# Patient Record
Sex: Female | Born: 2000 | Race: Black or African American | Hispanic: No | Marital: Single | Smoking: Never smoker
Health system: Southern US, Community
[De-identification: ages and names within clinical notes are randomized; demographics above are authoritative.]

---

## 2021-04-19 ENCOUNTER — Ambulatory Visit (HOSPITAL_COMMUNITY)
Admission: EM | Admit: 2021-04-19 | Discharge: 2021-04-19 | Disposition: A | Discharge status: Home / Self Care | Payer: Federal, State, Local not specified - PPO | Attending: Emergency Medicine | Admitting: Emergency Medicine

## 2021-04-19 ENCOUNTER — Other Ambulatory Visit: Payer: Self-pay

## 2021-04-19 ENCOUNTER — Encounter (HOSPITAL_COMMUNITY): Payer: Self-pay | Admitting: Emergency Medicine

## 2021-04-19 DIAGNOSIS — R519 Headache, unspecified: Secondary | ICD-10-CM | POA: Diagnosis not present

## 2021-04-19 MED ORDER — SUMATRIPTAN SUCCINATE 50 MG PO TABS: 50.0000 mg | ORAL_TABLET | ORAL | 0 refills | Status: AC | PRN

## 2021-04-19 MED ORDER — LIDOCAINE HCL (PF) 1 % IJ SOLN
INTRAMUSCULAR | Status: AC
  Filled 2021-04-19: qty 30

## 2021-04-19 MED ORDER — SUMATRIPTAN SUCCINATE 6 MG/0.5ML ~~LOC~~ SOLN
SUBCUTANEOUS | Status: AC
  Filled 2021-04-19: qty 0.5

## 2021-04-19 MED ORDER — IBUPROFEN 800 MG PO TABS: 800.0000 mg | ORAL_TABLET | Freq: Three times a day (TID) | ORAL | 0 refills | Status: AC

## 2021-04-19 MED ORDER — DEXAMETHASONE SODIUM PHOSPHATE 10 MG/ML IJ SOLN
10.0000 mg | Freq: Once | INTRAMUSCULAR | Status: AC
Start: 2021-04-19 — End: 2021-04-19
  Administered 2021-04-19: 10 mg via INTRAMUSCULAR

## 2021-04-19 MED ORDER — KETOROLAC TROMETHAMINE 30 MG/ML IJ SOLN
30.0000 mg | Freq: Once | INTRAMUSCULAR | Status: AC
  Administered 2021-04-19: 30 mg via INTRAMUSCULAR

## 2021-04-19 MED ORDER — SUMATRIPTAN SUCCINATE 6 MG/0.5ML ~~LOC~~ SOLN
6.0000 mg | Freq: Once | SUBCUTANEOUS | Status: AC
Start: 2021-04-19 — End: 2021-04-19
  Administered 2021-04-19: 6 mg via SUBCUTANEOUS

## 2021-04-19 MED ORDER — DEXAMETHASONE SODIUM PHOSPHATE 10 MG/ML IJ SOLN
INTRAMUSCULAR | Status: AC
  Filled 2021-04-19: qty 1

## 2021-04-19 MED ORDER — KETOROLAC TROMETHAMINE 30 MG/ML IJ SOLN
INTRAMUSCULAR | Status: AC
  Filled 2021-04-19: qty 1

## 2021-04-19 NOTE — ED Triage Notes (Signed)
Pt reports intermittent headache since December. Pt states the headaches have gotten worse over the past couple days.

## 2021-04-19 NOTE — ED Provider Notes (Signed)
[facility]    CSN: [id number] Arrival date & time: 04/19/21  1826      History   Chief Complaint Chief Complaint  Patient presents with   Migraine    HPI Erica Bruce is a 21 y.o. female.   Patient presents with right-sided frontal headache, sitting behind the right eye that has been occurring daily since mid December.  Pain initially felt like pressure but after taking a Z-Pak for a sinus infection pain is now described as aching.  Pain does not radiate.  Associated phonophobia.  Endorses that headache is generally a 7 out of 10 but at times can be a 10 out of 10.  Endorses she feels that symptoms are worsening.  Has been seen by PCP who prescribed Fiorinal which was not effective and then attempted use of a prednisone course, endorses that this toned down the headache but does not resolve it.  Denies photophobia, nausea, blurred vision, dizziness, weakness, lightheadedness, changes with speech.  Daily use of birth control pills.  No pertinent medical history.  Has been referred to neurology, unable to be seen until May 2023.   History reviewed. No pertinent past medical history.  There are no problems to display for this patient.   History reviewed. No pertinent surgical history.  OB History   No obstetric history on file.      Home Medications    Prior to Admission medications   Not on File    Family History Family History  Problem Relation Age of Onset   Healthy Mother    Healthy Father     Social History Social History   Tobacco Use   Smoking status: Never   Smokeless tobacco: Never     Allergies   Patient has no allergy information on record.   Review of Systems Review of Systems  Constitutional: Negative.   HENT: Negative.    Respiratory: Negative.    Cardiovascular: Negative.   Neurological:  Positive for headaches. Negative for dizziness, tremors, seizures, syncope, facial asymmetry, speech difficulty, weakness, light-headedness  and numbness.    Physical Exam Triage Vital Signs ED Triage Vitals  Enc Vitals Group     BP 04/19/21 1915 125/83     Pulse Rate 04/19/21 1915 70     Resp 04/19/21 1915 18     Temp 04/19/21 1915 97.7 F (36.5 C)     Temp Source 04/19/21 1915 Oral     SpO2 04/19/21 1915 99 %     Weight 04/19/21 1916 164 lb (74.4 kg)     Height 04/19/21 1916 5\' 8"  (1.727 m)     Head Circumference --      Peak Flow --      Pain Score 04/19/21 1916 8     Pain Loc --      Pain Edu? --      Excl. in GC? --    No data found.  Updated Vital Signs BP 125/83 (BP Location: Right Arm)    Pulse 70    Temp 97.7 F (36.5 C) (Oral)    Resp 18    Ht 5\' 8"  (1.727 m)    Wt 164 lb (74.4 kg)    SpO2 99%    BMI 24.94 kg/m   Visual Acuity Right Eye Distance:   Left Eye Distance:   Bilateral Distance:    Right Eye Near:   Left Eye Near:    Bilateral Near:     Physical Exam Constitutional:  Appearance: Normal appearance. She is normal weight.  HENT:     Head: Normocephalic.     Right Ear: Tympanic membrane, ear canal and external ear normal.     Left Ear: Tympanic membrane, ear canal and external ear normal.  Eyes:     Extraocular Movements: Extraocular movements intact.  Pulmonary:     Effort: Pulmonary effort is normal.  Skin:    General: Skin is warm and dry.  Neurological:     General: No focal deficit present.     Mental Status: She is alert and oriented to person, place, and time. Mental status is at baseline.  Psychiatric:        Mood and Affect: Mood normal.        Behavior: Behavior normal.     UC Treatments / Results  Labs (all labs ordered are listed, but only abnormal results are displayed) Labs Reviewed - No data to display  EKG   Radiology No results found.  Procedures Procedures (including critical care time)  Medications Ordered in UC Medications - No data to display  Initial Impression / Assessment and Plan / UC Course  I have reviewed the triage vital signs  and the nursing notes.  Pertinent labs & imaging results that were available during my care of the patient were reviewed by me and considered in my medical decision making (see chart for details).  Bad Headache  Vital signs are stable, patient is in no signs of distress, no abnormalities noted on the neurological exam, discussed with patient that continuation of headaches will require follow-up with neurology, advised patient to take appointment in May and recheck with office to see if she can be seen sooner, Toradol, Decadron and Imitrex injection given in office, prescribed ibuprofen and Imitrex for outpatient use, patient given strict precautions that for any signs of worsening symptoms or if symptoms require more frequent that she is to go to the nearest emergency department for further evaluation, verbalized understanding Final Clinical Impressions(s) / UC Diagnoses   Final diagnoses:  None   Discharge Instructions   None    ED Prescriptions   None    PDMP not reviewed this encounter.   Valinda Hoar, NP 04/20/21 1642

## 2021-04-19 NOTE — Discharge Instructions (Signed)
The cause of your headache is unknown at this time while it could be could be migraines, please watch carefully as it could also be something more serious, at any point if your headaches continue to become more frequent, worsening in severity or you have the worst headache you have ever experienced please go to the nearest emergency department immediately for evaluation  On your neurological exam today there appeared to be normal abnormalities, please reach out to the neurologist to set up an appointment even if it is far out  He has been given injections today to help ideally calm your headache or get rid of it so that you are able to manage at home  Once at home attempted use of ibuprofen first, if no improvement seen within an hour, take a dose of Imitrex then wait 2 hours if headache is still present you may take a second dose, do not take more than 4 tablets within a 24-hour.  You may follow-up with urgent care or primary care doctor as needed for further management until seen by neurologist

## 2021-04-22 NOTE — Progress Notes (Signed)
NEUROLOGY CONSULTATION NOTE  Erica Bruce MRN: [id number] DOB: [birth year]  Referring provider: Rolm Baptise, MD Primary care provider: Heywood Bene, PA-C  Reason for consult:  migraines  Assessment/Plan:   Migraine without aura, without status migrainosus, not intractable Persistent right orbital pain  Check MRI of brain with and without contrast Start nortriptyline 25mg  at bedtime.  Increase to 50mg  at bedtime in 6 weeks if needed. May use ibuprofen 800mg  or sumatriptan 50mg  as needed.  Limit use of pain relievers to no more than 2 days out of week to prevent risk of rebound or medication-overuse headache. Keep headache diary Advised to get formal eye exam. Follow up 4 months.    Subjective:  Erica Bruce is a 21 year old right-handed female who presents for migraines.  History supplemented by Urgent Care and referring provider's notes.  About a couple of months ago, she developed a new persistent right sided pressure-like headache that has gotten worse.  No preceding injury or illness.  She was treated with a prednisone taper and later a Z pak for presumed sinusitis which helped reduce severity but the pressure persisted.  She now has a persistent right periorbital/orbital dull pressure.  The right eye feels different to touch when compared to the left eye.   About once a week she will have a 7-8/10 frontal pressure headache with phonophobia but no photophobia, nausea, vomiting, visual disturbance, numbness or weakness.  Lasts 30 minutes when treated with ibuprofen.  No specific triggers.  Taking out her contact lenses may help.    Current NSAIDS/analgesics:  ibuprofen 800mg  Current triptans:  sumatriptan 50mg  Current ergotamine:  none Current anti-emetic:  none Current muscle relaxants:  none Current Antihypertensive medications:  none Current Antidepressant medications:  none Current Anticonvulsant medications:  none Current anti-CGRP:  none Current  Vitamins/Herbal/Supplements:  none Current Antihistamines/Decongestants:  none Other therapy:  none Hormone/birth control:  none  Past NSAIDS/analgesics:  Fiorinal  Past abortive triptans:  sumatriptan 6mg  [place] Past abortive ergotamine:  none Past muscle relaxants:  none Past anti-emetic:  none Past antihypertensive medications:  none Past antidepressant medications:  none Past anticonvulsant medications:  none Past anti-CGRP:  none Past vitamins/Herbal/Supplements:  none Past antihistamines/decongestants:  none Other past therapies:  no  Caffeine:  Rarely coffee.  Red Bull once in a while.   Alcohol:  no Smoker:  no Diet:  drinks water throughout the day.  No specific diet.   Exercise:  not routine Depression:  no; Anxiety:  yes Other pain:  no Sleep hygiene:  varies.  Goes to bed late.  Sleeps 7-10 hours a night Family history of headache:  no      PAST MEDICAL HISTORY: No past medical history on file.  PAST SURGICAL HISTORY: No past surgical history on file.  MEDICATIONS: Current Outpatient Medications on File Prior to Visit  Medication Sig Dispense Refill   ibuprofen (ADVIL) 800 MG tablet Take 1 tablet (800 mg total) by mouth 3 (three) times daily. 21 tablet 0   SUMAtriptan (IMITREX) 50 MG tablet Take 1 tablet (50 mg total) by mouth every 2 (two) hours as needed for migraine. May repeat in 2 hours if headache persists or recurs. 10 tablet 0   No current facility-administered medications on file prior to visit.    ALLERGIES: Not on File  FAMILY HISTORY: Family History  Problem Relation Age of Onset   Healthy Mother    Healthy Father     Objective:  Blood pressure 114/75, pulse 94, height 5'  8" (1.727 m), weight 169 lb (76.7 kg), SpO2 100 %. General: No acute distress.  Patient appears well-groomed.   Head:  Normocephalic/atraumatic Eyes:  fundi examined but not visualized Neck: supple, no paraspinal tenderness, full range of motion Back: No paraspinal  tenderness Heart: regular rate and rhythm Lungs: Clear to auscultation bilaterally. Vascular: No carotid bruits. Neurological Exam: Mental status: alert and oriented to person, place, and time, recent and remote memory intact, fund of knowledge intact, attention and concentration intact, speech fluent and not dysarthric, language intact. Cranial nerves: CN I: not tested CN II: pupils equal, round and reactive to light, visual fields intact CN III, IV, VI:  full range of motion, no nystagmus, no ptosis CN V: facial sensation intact. CN VII: upper and lower face symmetric CN VIII: hearing intact CN IX, X: gag intact, uvula midline CN XI: sternocleidomastoid and trapezius muscles intact CN XII: tongue midline Bulk & Tone: normal, no fasciculations. Motor:  muscle strength 5/5 throughout Sensation:  Pinprick, temperature and vibratory sensation intact. Deep Tendon Reflexes:  2+ throughout,  toes downgoing.   Finger to nose testing:  Without dysmetria.   Heel to shin:  Without dysmetria.   Gait:  Normal station and stride.  Romberg negative.    Thank you for allowing me to take part in the care of this patient.  Metta Clines, DO  CC: Rolm Baptise, MD

## 2021-04-25 ENCOUNTER — Ambulatory Visit: Payer: Federal, State, Local not specified - PPO | Admitting: Neurology

## 2021-04-25 ENCOUNTER — Encounter: Payer: Self-pay | Admitting: Neurology

## 2021-04-25 ENCOUNTER — Other Ambulatory Visit: Payer: Self-pay

## 2021-04-25 VITALS — BP 114/75 | HR 94 | Ht 68.0 in | Wt 169.0 lb

## 2021-04-25 DIAGNOSIS — G4452 New daily persistent headache (NDPH): Secondary | ICD-10-CM | POA: Diagnosis not present

## 2021-04-25 DIAGNOSIS — G43009 Migraine without aura, not intractable, without status migrainosus: Secondary | ICD-10-CM | POA: Diagnosis not present

## 2021-04-25 MED ORDER — NORTRIPTYLINE HCL 25 MG PO CAPS: 25.0000 mg | ORAL_CAPSULE | Freq: Every day | ORAL | 3 refills | Status: DC

## 2021-04-25 NOTE — Patient Instructions (Signed)
Start nortriptyline 25mg  at bedtime.  If no improvement in 6 weeks, contact me Limit use of pain relievers to no more than 2 days out of week to prevent risk of rebound or medication-overuse headache. Keep headache diary MRI of brain with and without contrast Follow up 4 months.

## 2021-05-13 ENCOUNTER — Ambulatory Visit
Admission: RE | Admit: 2021-05-13 | Discharge: 2021-05-13 | Disposition: A | Discharge status: Home / Self Care | Payer: Federal, State, Local not specified - PPO | Source: Ambulatory Visit | Attending: Neurology | Admitting: Neurology

## 2021-05-13 ENCOUNTER — Other Ambulatory Visit: Payer: Self-pay

## 2021-05-13 DIAGNOSIS — G4452 New daily persistent headache (NDPH): Secondary | ICD-10-CM

## 2021-05-13 DIAGNOSIS — G43009 Migraine without aura, not intractable, without status migrainosus: Secondary | ICD-10-CM

## 2021-05-13 MED ORDER — GADOBENATE DIMEGLUMINE 529 MG/ML IV SOLN
15.0000 mL | Freq: Once | INTRAVENOUS | Status: AC | PRN
  Administered 2021-05-13: 15 mL via INTRAVENOUS

## 2021-05-13 NOTE — Progress Notes (Signed)
Left message to contact office 05/13/2021 at 148  ?

## 2021-05-17 ENCOUNTER — Telehealth: Payer: Self-pay | Admitting: Neurology

## 2021-05-17 NOTE — Telephone Encounter (Signed)
Patient called and stated she need her nortriptyline prescription sent to [facility] in [place] .  [address] [id number].  She is away at school. ?

## 2021-05-18 MED ORDER — NORTRIPTYLINE HCL 25 MG PO CAPS: 25.0000 mg | ORAL_CAPSULE | Freq: Every day | ORAL | 3 refills | Status: DC

## 2021-06-04 ENCOUNTER — Encounter (HOSPITAL_COMMUNITY): Payer: Self-pay | Admitting: Emergency Medicine

## 2021-06-04 ENCOUNTER — Emergency Department (HOSPITAL_COMMUNITY)
Admission: EM | Admit: 2021-06-04 | Discharge: 2021-06-04 | Disposition: A | Discharge status: Home / Self Care | Payer: Federal, State, Local not specified - PPO | Attending: Emergency Medicine | Admitting: Emergency Medicine

## 2021-06-04 ENCOUNTER — Other Ambulatory Visit: Payer: Self-pay

## 2021-06-04 DIAGNOSIS — J029 Acute pharyngitis, unspecified: Secondary | ICD-10-CM

## 2021-06-04 DIAGNOSIS — Z20822 Contact with and (suspected) exposure to covid-19: Secondary | ICD-10-CM | POA: Diagnosis not present

## 2021-06-04 DIAGNOSIS — J028 Acute pharyngitis due to other specified organisms: Secondary | ICD-10-CM | POA: Diagnosis not present

## 2021-06-04 DIAGNOSIS — B9789 Other viral agents as the cause of diseases classified elsewhere: Secondary | ICD-10-CM | POA: Diagnosis not present

## 2021-06-04 LAB — RESP PANEL BY RT-PCR (FLU A&B, COVID) ARPGX2
Influenza A by PCR: NEGATIVE
Influenza B by PCR: NEGATIVE
SARS Coronavirus 2 by RT PCR: NEGATIVE

## 2021-06-04 LAB — GROUP A STREP BY PCR: Group A Strep by PCR: NOT DETECTED

## 2021-06-04 NOTE — ED Triage Notes (Signed)
C/o sore throat, body aches, and feeling hot for 2-3 days. ?

## 2021-06-04 NOTE — Discharge Instructions (Signed)
Your COVID, flu, and strep testing were all negative today. Your symptoms are likely viral in nature and can take up to 7-10 days for your body to fight off.  ? ?Please take Ibuprofen and Tylenol as needed for your sore throat and body aches. Continue drinking plenty of fluids to stay hydrated and rest as much as possible.  ? ?Follow up with your PCP for further evaluation ? ?Return to the ED for any new/worsening symptoms including worsening sore throat, inability to swallow, drooling on yourself, muffled voice, or any other new/concerning symptoms ?

## 2021-06-04 NOTE — ED Provider Notes (Signed)
?[facility] EMERGENCY DEPARTMENT ?Provider Note ? ? ?CSN: [id number] ?Arrival date & time: 06/04/21  1459 ? ?  ? ?History ? ?No chief complaint on file. ? ? ?Erica Bruce is a 21 y.o. female who presents to the ED today with complaint of gradual onset, constant, achy, sore throat that began yesterday. Pt also complains of subjective fevers and body aches. Denies any recent sick contacts. Has not taken anything specifically for her symptoms. Denies difficulty swallowing, drooling, voice change, ear pain, sneezing, rhinorrhea, or any other associated symptoms.  ? ?The history is provided by the patient and medical records.  ? ?  ? ?Home Medications ?Prior to Admission medications   ?Medication Sig Start Date End Date Taking? Authorizing Provider  ?ibuprofen (ADVIL) 800 MG tablet Take 1 tablet (800 mg total) by mouth 3 (three) times daily. 04/19/21   Valinda Hoar, NP  ?nortriptyline (PAMELOR) 25 MG capsule Take 1 capsule (25 mg total) by mouth at bedtime. 05/18/21   Drema Dallas, DO  ?SUMAtriptan (IMITREX) 50 MG tablet Take 1 tablet (50 mg total) by mouth every 2 (two) hours as needed for migraine. May repeat in 2 hours if headache persists or recurs. 04/19/21   Valinda Hoar, NP  ?   ? ?Allergies    ?Patient has no known allergies.   ? ?Review of Systems   ?Review of Systems  ?Constitutional:  Negative for chills and fever.  ?HENT:  Positive for sore throat. Negative for ear pain, trouble swallowing and voice change.   ?Respiratory:  Negative for cough.   ?Musculoskeletal:  Positive for myalgias.  ?All other systems reviewed and are negative. ? ?Physical Exam ?Updated Vital Signs ?BP 115/75   Pulse (!) 111   Temp 99.2 ?F (37.3 ?C)   Resp 20   LMP 05/09/2021   SpO2 99%  ?Physical Exam ?Vitals and nursing note reviewed.  ?Constitutional:   ?   Appearance: She is not ill-appearing.  ?HENT:  ?   Head: Normocephalic and atraumatic.  ?   Mouth/Throat:  ?   Mouth: Mucous membranes are moist.  ?    Pharynx: Posterior oropharyngeal erythema present. No oropharyngeal exudate.  ?   Comments: Phonating normally. Tolerating own secretions without difficulty. Uvula midline. Mild posterior oropharyngeal erythema and edema. No exudate.  ?Eyes:  ?   Conjunctiva/sclera: Conjunctivae normal.  ?Cardiovascular:  ?   Rate and Rhythm: Normal rate and regular rhythm.  ?Pulmonary:  ?   Effort: Pulmonary effort is normal.  ?   Breath sounds: Normal breath sounds. No wheezing, rhonchi or rales.  ?Skin: ?   General: Skin is warm and dry.  ?   Coloration: Skin is not jaundiced.  ?Neurological:  ?   Mental Status: She is alert.  ? ? ?ED Results / Procedures / Treatments   ?Labs ?(all labs ordered are listed, but only abnormal results are displayed) ?Labs Reviewed  ?RESP PANEL BY RT-PCR (FLU A&B, COVID) [id number]  ?GROUP A STREP BY PCR  ? ? ?EKG ?None ? ?Radiology ?No results found. ? ?Procedures ?Procedures  ? ? ?Medications Ordered in ED ?Medications - No data to display ? ?ED Course/ Medical Decision Making/ A&P ?  ?                        ?Medical Decision Making ?21 year old female who presents to the ED today with complaint of sore throat, body aches, subjective fevers x1 day.  On arrival  to the ED temperature is 99.2.  She is mildly tachycardic in the 110s.  Remainder vitals unremarkable.  She had COVID, flu, strep test collected while in the waiting room currently pending.  We will plan to await on results at this time.  On exam she has some mild posterior oropharyngeal erythema and edema without exudate.  Uvula midline.  Tolerating own secretions without difficulty.  No concern for PTA or other deep space infection of the neck.  Her lungs are clear to auscultation bilaterally denies any other associated symptoms.  Do not feel she requires any additional labs or imaging at this time.  ? ?Workup overall reassuring - covid, flu, and strep testing are negative today. Suspect viral in nature. Have recommend Ibuprofen/Tylenol as  needed for pain and PCP follow up. Pt in agreement with plan and stable for discharge home.  ? ?Problems Addressed: ?Viral pharyngitis: acute illness or injury ? ?Amount and/or Complexity of Data Reviewed ?Labs: ordered. ?   Details: COVID and flu negative ?Strep negative ? ? ? ? ? ? ? ? ? ?Final Clinical Impression(s) / ED Diagnoses ?Final diagnoses:  ?Viral pharyngitis  ? ? ?Rx / DC Orders ?ED Discharge Orders   ? ? None  ? ?  ? ? ? ?Discharge Instructions   ? ?  ?Your COVID, flu, and strep testing were all negative today. Your symptoms are likely viral in nature and can take up to 7-10 days for your body to fight off.  ? ?Please take Ibuprofen and Tylenol as needed for your sore throat and body aches. Continue drinking plenty of fluids to stay hydrated and rest as much as possible.  ? ?Follow up with your PCP for further evaluation ? ?Return to the ED for any new/worsening symptoms including worsening sore throat, inability to swallow, drooling on yourself, muffled voice, or any other new/concerning symptoms ? ? ? ? ? ?  ?Tanda Rockers, PA-C ?06/04/21 1644 ? ?  ?Jacalyn Lefevre, MD ?06/04/21 1755 ? ?

## 2021-06-30 NOTE — Progress Notes (Addendum)
? ?NEUROLOGY FOLLOW UP OFFICE NOTE ? ?Erica Bruce ?[id number] ? ?Assessment/Plan:  ? ?Migraine without aura, without status migrainosus, not intractable ?Persistent right orbital pain ?  ?Increase nortriptyline to 50mg  at bedtime.   ?May use ibuprofen 800mg  or sumatriptan 50mg  as needed.  Limit use of pain relievers to no more than 2 days out of week to prevent risk of rebound or medication-overuse headache.  Has not needed it as no significant headache ?Keep headache diary ?Advised to get formal eye exam. ?Follow up 5 months. ? ?Subjective:  ?Erica Bruce is a 21 year old right-handed female who follows up for migraine. ? ?UPDATE: ?MRI of brain with and without contrast on 05/13/2021 personally reviewed is normal.  She had an eye exam which was normal.   ? ?Started nortriptyline. ?Still persistent pressure but not as severe.  It is 6/10.  During day it may fade away but then becomes more noticeable.  Still with some mild anxiety and  a sensation of dissociation ?Current NSAIDS/analgesics:  ibuprofen 800mg  ?Current triptans:  sumatriptan 50mg  ?Current ergotamine:  none ?Current anti-emetic:  none ?Current muscle relaxants:  none ?Current Antihypertensive medications:  none ?Current Antidepressant medications:  nortriptyline 25mg  QHS ?Current Anticonvulsant medications:  none ?Current anti-CGRP:  none ?Current Vitamins/Herbal/Supplements:  none ?Current Antihistamines/Decongestants:  none ?Other therapy:  none ?Hormone/birth control:  none ? ?Caffeine:  Rarely coffee.  Red Bull once in a while.   ?Alcohol:  no ?Smoker:  no ?Diet:  drinks water throughout the day.  No specific diet.   ?Exercise:  not routine ?Depression:  no; Anxiety:  yes ?Other pain:  no ?Sleep hygiene:  varies.  Goes to bed late.  Sleeps 7-10 hours a night ? ?HISTORY:  ?About a couple of months ago, she developed a new persistent right sided pressure-like headache that has gotten worse.  No preceding injury or illness.  She was treated with a  prednisone taper and later a Z pak for presumed sinusitis which helped reduce severity but the pressure persisted.  She now has a persistent right periorbital/orbital dull pressure.  The right eye feels different to touch when compared to the left  ?eye.   About once a week she will have a 7-8/10 frontal pressure headache with phonophobia but no photophobia, nausea, vomiting, visual disturbance, numbness or weakness.  Lasts 30 minutes when treated with ibuprofen.  No specific triggers.  Taking out her contact lenses may help.   ?  ?Past NSAIDS/analgesics:  Fiorinal  ?Past abortive triptans:  sumatriptan 6mg   ?Past abortive ergotamine:  none ?Past muscle relaxants:  none ?Past anti-emetic:  none ?Past antihypertensive medications:  none ?Past antidepressant medications:  none ?Past anticonvulsant medications:  none ?Past anti-CGRP:  none ?Past vitamins/Herbal/Supplements:  none ?Past antihistamines/decongestants:  none ?Other past therapies:  no ?  ? ?Family history of headache:  no ? ?PAST MEDICAL HISTORY: ?No past medical history on file. ? ?MEDICATIONS: ?Current Outpatient Medications on File Prior to Visit  ?Medication Sig Dispense Refill  ? ibuprofen (ADVIL) 800 MG tablet Take 1 tablet (800 mg total) by mouth 3 (three) times daily. 21 tablet 0  ? nortriptyline (PAMELOR) 25 MG capsule Take 1 capsule (25 mg total) by mouth at bedtime. 30 capsule 3  ? SUMAtriptan (IMITREX) 50 MG tablet Take 1 tablet (50 mg total) by mouth every 2 (two) hours as needed for migraine. May repeat in 2 hours if headache persists or recurs. 10 tablet 0  ? ?No current facility-administered medications on file prior  to visit.  ? ? ?ALLERGIES: ?No Known Allergies ? ?FAMILY HISTORY: ?Family History  ?Problem Relation Age of Onset  ? Healthy Mother   ? Healthy Father   ? ? ?  ?Objective:  ?Blood pressure 106/73, pulse (!) 114, height 5'8", weight 171 lb (77.6 kg), SpO2 100 % ?General: No acute distress.  Patient appears well-groomed.    ? ? ? ?Shon Millet, DO ? ? ? ? ? ? ?

## 2021-07-04 ENCOUNTER — Encounter: Payer: Self-pay | Admitting: Neurology

## 2021-07-04 ENCOUNTER — Ambulatory Visit: Payer: Federal, State, Local not specified - PPO | Admitting: Neurology

## 2021-07-04 VITALS — BP 106/73 | HR 114 | Ht 68.0 in | Wt 171.0 lb

## 2021-07-04 DIAGNOSIS — G4452 New daily persistent headache (NDPH): Secondary | ICD-10-CM

## 2021-07-04 MED ORDER — NORTRIPTYLINE HCL 50 MG PO CAPS: 50.0000 mg | ORAL_CAPSULE | Freq: Every day | ORAL | 4 refills | Status: DC

## 2021-07-04 NOTE — Patient Instructions (Signed)
Increase nortriptyline to 50mg  at bedtime.  If no improvement in 8 weeks, contact me.  If you have side effects, let me know ? ?Take ibuprofen or sumatriptan as needed.  Limit use of pain relievers to no more than 2 days out of week to prevent risk of rebound or medication-overuse headache. ? ?

## 2021-07-12 ENCOUNTER — Telehealth: Payer: Self-pay | Admitting: Neurology

## 2021-07-12 MED ORDER — NORTRIPTYLINE HCL 50 MG PO CAPS: 50.0000 mg | ORAL_CAPSULE | Freq: Every day | ORAL | 4 refills | Status: DC

## 2021-07-12 NOTE — Telephone Encounter (Signed)
Pt called in, she goes to school locally, but is now at home for the summer. She needs her nortriptyline prescription sent to the [facility] at [place],  [place]. Their phone number is [phone]. ?

## 2021-11-23 ENCOUNTER — Other Ambulatory Visit: Payer: Self-pay

## 2021-11-23 ENCOUNTER — Ambulatory Visit (HOSPITAL_COMMUNITY)
Admission: EM | Admit: 2021-11-23 | Discharge: 2021-11-23 | Disposition: A | Discharge status: Home / Self Care | Payer: Federal, State, Local not specified - PPO | Attending: Family Medicine | Admitting: Family Medicine

## 2021-11-23 ENCOUNTER — Encounter (HOSPITAL_COMMUNITY): Payer: Self-pay | Admitting: Emergency Medicine

## 2021-11-23 DIAGNOSIS — R1030 Lower abdominal pain, unspecified: Secondary | ICD-10-CM | POA: Diagnosis present

## 2021-11-23 DIAGNOSIS — Z3202 Encounter for pregnancy test, result negative: Secondary | ICD-10-CM | POA: Diagnosis not present

## 2021-11-23 LAB — CBC WITH DIFFERENTIAL/PLATELET
Abs Immature Granulocytes: 0.01 10*3/uL (ref 0.00–0.07)
Basophils Absolute: 0 10*3/uL (ref 0.0–0.1)
Basophils Relative: 0 %
Eosinophils Absolute: 0 10*3/uL (ref 0.0–0.5)
Eosinophils Relative: 1 %
HCT: 31.8 % — ABNORMAL LOW (ref 36.0–46.0)
Hemoglobin: 9.9 g/dL — ABNORMAL LOW (ref 12.0–15.0)
Immature Granulocytes: 0 %
Lymphocytes Relative: 23 %
Lymphs Abs: 1 10*3/uL (ref 0.7–4.0)
MCH: 24.9 pg — ABNORMAL LOW (ref 26.0–34.0)
MCHC: 31.1 g/dL (ref 30.0–36.0)
MCV: 80.1 fL (ref 80.0–100.0)
Monocytes Absolute: 0.4 10*3/uL (ref 0.1–1.0)
Monocytes Relative: 9 %
Neutro Abs: 2.8 10*3/uL (ref 1.7–7.7)
Neutrophils Relative %: 67 %
Platelets: 212 10*3/uL (ref 150–400)
RBC: 3.97 MIL/uL (ref 3.87–5.11)
RDW: 13.5 % (ref 11.5–15.5)
WBC: 4.2 10*3/uL (ref 4.0–10.5)
nRBC: 0 % (ref 0.0–0.2)

## 2021-11-23 LAB — POCT URINALYSIS DIPSTICK, ED / UC
Bilirubin Urine: NEGATIVE
Glucose, UA: NEGATIVE mg/dL
Hgb urine dipstick: NEGATIVE
Ketones, ur: NEGATIVE mg/dL
Leukocytes,Ua: NEGATIVE
Nitrite: NEGATIVE
Protein, ur: NEGATIVE mg/dL
Specific Gravity, Urine: 1.025 (ref 1.005–1.030)
Urobilinogen, UA: 1 mg/dL (ref 0.0–1.0)
pH: 7 (ref 5.0–8.0)

## 2021-11-23 LAB — COMPREHENSIVE METABOLIC PANEL
ALT: 12 U/L (ref 0–44)
AST: 24 U/L (ref 15–41)
Albumin: 4 g/dL (ref 3.5–5.0)
Alkaline Phosphatase: 36 U/L — ABNORMAL LOW (ref 38–126)
Anion gap: 6 (ref 5–15)
BUN: 10 mg/dL (ref 6–20)
CO2: 25 mmol/L (ref 22–32)
Calcium: 9.3 mg/dL (ref 8.9–10.3)
Chloride: 105 mmol/L (ref 98–111)
Creatinine, Ser: 0.87 mg/dL (ref 0.44–1.00)
GFR, Estimated: >60 mL/min (ref >60)
Glucose, Bld: 74 mg/dL (ref 70–99)
Potassium: 4.1 mmol/L (ref 3.5–5.1)
Sodium: 136 mmol/L (ref 135–145)
Total Bilirubin: 0.7 mg/dL (ref 0.3–1.2)
Total Protein: 7.9 g/dL (ref 6.5–8.1)

## 2021-11-23 LAB — POC URINE PREG, ED: Preg Test, Ur: NEGATIVE

## 2021-11-23 LAB — LIPASE, BLOOD: Lipase: 26 U/L (ref 11–51)

## 2021-11-23 MED ORDER — KETOROLAC TROMETHAMINE 60 MG/2ML IM SOLN
60.0000 mg | Freq: Once | INTRAMUSCULAR | Status: AC
  Administered 2021-11-23: 60 mg via INTRAMUSCULAR

## 2021-11-23 MED ORDER — KETOROLAC TROMETHAMINE 60 MG/2ML IM SOLN
INTRAMUSCULAR | Status: AC
  Filled 2021-11-23: qty 2

## 2021-11-23 NOTE — ED Triage Notes (Addendum)
Abdominal cramping started Monday.  Took medication, used heating pad with relief.  Symptoms returned yesterday.  LMP 10/29/2021-reported as a normal period.  Denies birth control or discontinuation.  Denies vaginal discharge, denies urinary issues.  No nausea, vomiting or diarrhea.  Last BM was yesterday, normal per patient

## 2021-11-23 NOTE — Discharge Instructions (Signed)
You have been seen today for abdominal pain. Your evaluation was not suggestive of any emergent condition requiring medical intervention at this time. However, some abdominal problems make take more time to appear. Therefore, it is very important for you to pay attention to any new symptoms or worsening of your current condition.  Please return here or to the Emergency Department immediately should you begin to feel worse in any way or have any of the following symptoms: increasing or different abdominal pain, persistent vomiting, inability to drink fluids, fevers, or shaking chills.   You have had labs (blood work) sent today. We will call you with any significant abnormalities or if there is need to begin or change treatment or pursue further follow up.  You may also review your test results online through MyChart. If you do not have a MyChart account, instructions to sign up should be on your discharge paperwork.  

## 2021-11-23 NOTE — ED Provider Notes (Signed)
[place]   [id number] 11/23/21 Arrival Time: 1228  ASSESSMENT & PLAN:  1. Lower abdominal pain    Benign abdominal exam. No indications for urgent abdominal/pelvic imaging at this time. Discussed.  Pending: Labs Reviewed  CBC WITH DIFFERENTIAL/PLATELET  LIPASE, BLOOD  COMPREHENSIVE METABOLIC PANEL   Suspect symptoms related to expected menstrual period.  Meds ordered this encounter  Medications   ketorolac (TORADOL) injection 60 mg     Discharge Instructions      You have been seen today for abdominal pain. Your evaluation was not suggestive of any emergent condition requiring medical intervention at this time. However, some abdominal problems make take more time to appear. Therefore, it is very important for you to pay attention to any new symptoms or worsening of your current condition.  Please return here or to the Emergency Department immediately should you begin to feel worse in any way or have any of the following symptoms: increasing or different abdominal pain, persistent vomiting, inability to drink fluids, fevers, or shaking chills.   You have had labs (blood work) sent today. We will call you with any significant abnormalities or if there is need to begin or change treatment or pursue further follow up.  You may also review your test results online through [facility]. If you do not have a MyChart account, instructions to sign up should be on your discharge paperwork.     Follow-up Information     [place].   Specialty: Emergency Medicine Why: If symptoms worsen in any way. Contact information: [address] [phone] [place] [phone]                Reviewed expectations re: course of current medical issues. Questions answered. Outlined signs and symptoms indicating need for more acute intervention. Patient verbalized understanding. After Visit Summary  given.   SUBJECTIVE: History from: patient. Erica Bruce is a 21 y.o. female who presents with complaint of intermittent lower midline abdominal discomfort. Onset gradual,  first noted approx 48 h ago upon waking . Discomfort described as cramping; without radiation; does not wake her at night. Reports normal flatus. Symptoms are waxing/waning since beginning. Fever: absent. Aggravating factors: have not been identified. Alleviating factors: have not been identified. Associated symptoms: none reported. She denies anorexia, arthralgias, belching, chills, constipation, diarrhea, dysuria, fever, headache, hematuria, melena, myalgias, nausea, sweats, and vomiting. Appetite: normal. PO intake: normal. Ambulatory without assistance. Urinary symptoms: none. Bowel movements: have not significantly changed. No tx PTA. Patient's last menstrual period was 10/29/2021 (exact date). "Feels like I'm having menstrual cramps but usually not this bad".  History reviewed. No pertinent surgical history.   OBJECTIVE:  Vitals:   11/23/21 1345  BP: 120/86  Pulse: 96  Resp: 18  Temp: 98.8 F (37.1 C)  SpO2: 99%    General appearance: alert, oriented, no acute distress HEENT: [place]; AT; oropharynx moist Lungs: unlabored respirations Abdomen: soft; without distention; mild  and poorly localized tenderness to palpation over midline lower abdomen ; normal bowel sounds; without guarding or rebound tenderness Back: without reported CVA tenderness; FROM at waist Extremities: without LE edema; symmetrical; without gross deformities Skin: warm and dry Neurologic: normal gait Psychological: alert and cooperative; normal mood and affect  Labs: Results for orders placed or performed during the hospital encounter of 11/23/21  POC Urinalysis dipstick  Result Value Ref Range   Glucose, UA NEGATIVE NEGATIVE mg/dL   Bilirubin Urine NEGATIVE NEGATIVE   Ketones,  ur NEGATIVE NEGATIVE mg/dL   Specific Gravity, Urine 1.025  1.005 - 1.030   Hgb urine dipstick NEGATIVE NEGATIVE   pH 7.0 5.0 - 8.0   Protein, ur NEGATIVE NEGATIVE mg/dL   Urobilinogen, UA 1.0 0.0 - 1.0 mg/dL   Nitrite NEGATIVE NEGATIVE   Leukocytes,Ua NEGATIVE NEGATIVE  POC urine pregnancy  Result Value Ref Range   Preg Test, Ur NEGATIVE NEGATIVE   Labs Reviewed  CBC WITH DIFFERENTIAL/PLATELET  LIPASE, BLOOD  COMPREHENSIVE METABOLIC PANEL  POCT URINALYSIS DIPSTICK, ED / UC  POC URINE PREG, ED     No Known Allergies                                             History reviewed. No pertinent past medical history.  Social History   Socioeconomic History   Marital status: Single    Spouse name: Not on file   Number of children: Not on file   Years of education: Not on file   Highest education level: Not on file  Occupational History   Not on file  Tobacco Use   Smoking status: Never   Smokeless tobacco: Never  Vaping Use   Vaping Use: Never used  Substance and Sexual Activity   Alcohol use: Not Currently   Drug use: Not Currently   Sexual activity: Not on file  Other Topics Concern   Not on file  Social History Narrative   Right Handed   Lives in a dorm   Drinks some caffeine   Social Determinants of Health   Financial Resource Strain: Not on file  Food Insecurity: Not on file  Transportation Needs: Not on file  Physical Activity: Not on file  Stress: Not on file  Social Connections: Not on file  Intimate Partner Violence: Not on file    Family History  Problem Relation Age of Onset   Healthy Mother    Cerebral aneurysm Father      Mardella Layman, MD 11/23/21 1455

## 2021-12-05 NOTE — Progress Notes (Signed)
NEUROLOGY FOLLOW UP OFFICE NOTE  Erica Bruce [id number]  Assessment/Plan:   Migraine without aura, without status migrainosus, not intractable - stable Persistent right orbital pressure   Increase nortriptyline to 75mg  at bedtime.    May use ibuprofen 800mg  or sumatriptan 50mg  as needed.  Limit use of pain relievers to no more than 2 days out of week to prevent risk of rebound or medication-overuse headache.  Has not needed it as no significant headache Keep headache diary Advised to get formal eye exam. Follow up 4-5 months.   Subjective:  Erica Bruce is a 21 year old right-handed female who follows up for migraine.   UPDATE: Last visit, increased nortriptyline.   No headaches.  Still with a daily head "pressure" around the right eye and along the jaw line, about 6/10 severity.  Not present when she first wakes up but gradually progresses throughout the day, worse at night.  Eye exam was okay.  Not aware if she clenches her jaw during the day.    She still sometimes when she lays down at night to in bed (either about to fall asleep or during sleep), she may have sensation of panic and will wake up.  Usually occurs if she forgets to take the nortriptyline.  Current NSAIDS/analgesics:  ibuprofen 800mg  Current triptans:  sumatriptan 50mg  Current ergotamine:  none Current anti-emetic:  none Current muscle relaxants:  none Current Antihypertensive medications:  none Current Antidepressant medications:  nortriptyline 50mg  [facility] Current Anticonvulsant medications:  none Current anti-CGRP:  none Current Vitamins/Herbal/Supplements:  none Current Antihistamines/Decongestants:  none Other therapy:  none Hormone/birth control:  none   Caffeine:  Rarely coffee.  Red Bull once in a while.   Alcohol:  no Smoker:  no Diet:  drinks water throughout the day.  No specific diet.   Exercise:  not routine Depression:  no; Anxiety:  yes Other pain:  no Sleep hygiene:  varies.  Goes to bed  late.  Sleeps 7-10 hours a night   HISTORY:  In December 2022, she developed a new persistent right sided pressure-like headache that has gotten worse.  No preceding injury or illness.  She was treated with a prednisone taper and later a Z pak for presumed sinusitis which helped reduce severity but the pressure persisted.  She now has a persistent right periorbital/orbital dull pressure.  The right eye feels different to touch when compared to the left  eye.   About once a week she will have a 7-8/10 frontal pressure headache with phonophobia but no photophobia, nausea, vomiting, visual disturbance, numbness or weakness.  Lasts 30 minutes when treated with ibuprofen.  No specific triggers.  Taking out her contact lenses may help.    MRI of brain with and without contrast on 05/13/2021 was normal.  She had an eye exam which was normal.    Past NSAIDS/analgesics:  Fiorinal  Past abortive triptans:  sumatriptan 6mg  [place] Past abortive ergotamine:  none Past muscle relaxants:  none Past anti-emetic:  none Past antihypertensive medications:  none Past antidepressant medications:  none Past anticonvulsant medications:  none Past anti-CGRP:  none Past vitamins/Herbal/Supplements:  none Past antihistamines/decongestants:  none Other past therapies:  no     Family history of headache:  no  PAST MEDICAL HISTORY: No past medical history on file.  MEDICATIONS: Current Outpatient Medications on File Prior to Visit  Medication Sig Dispense Refill   ibuprofen (ADVIL) 800 MG tablet Take 1 tablet (800 mg total) by mouth 3 (three) times  daily. 21 tablet 0   nortriptyline (PAMELOR) 50 MG capsule Take 1 capsule (50 mg total) by mouth at bedtime. 30 capsule 4   SUMAtriptan (IMITREX) 50 MG tablet Take 1 tablet (50 mg total) by mouth every 2 (two) hours as needed for migraine. May repeat in 2 hours if headache persists or recurs. 10 tablet 0   No current facility-administered medications on file prior to  visit.    ALLERGIES: No Known Allergies  FAMILY HISTORY: Family History  Problem Relation Age of Onset   Healthy Mother    Cerebral aneurysm Father       Objective:  Blood pressure 110/77, pulse 83, height 5\' 8"  (1.727 m), weight 191 lb (86.6 kg), last menstrual period 10/29/2021, SpO2 97 %. General: No acute distress.  Patient appears well-groomed.   Head:  Normocephalic/atraumatic Eyes:  Fundi examined but not visualized Neck: supple, no paraspinal tenderness, full range of motion Heart:  Regular rate and rhythm Neurological Exam: alert and oriented to person, place, and time.  Speech fluent and not dysarthric, language intact.  CN II-XII intact. Bulk and tone normal, muscle strength 5/5 throughout.  Sensation to light touch intact.  Deep tendon reflexes 2+ throughout, toes downgoing.  Finger to nose testing intact.  Gait normal, Romberg negative.   10/31/2021, DO

## 2021-12-06 ENCOUNTER — Ambulatory Visit: Payer: Federal, State, Local not specified - PPO | Admitting: Neurology

## 2021-12-06 ENCOUNTER — Encounter: Payer: Self-pay | Admitting: Neurology

## 2021-12-06 VITALS — BP 110/77 | HR 83 | Ht 68.0 in | Wt 191.0 lb

## 2021-12-06 DIAGNOSIS — G43009 Migraine without aura, not intractable, without status migrainosus: Secondary | ICD-10-CM | POA: Diagnosis not present

## 2021-12-06 DIAGNOSIS — H579 Unspecified disorder of eye and adnexa: Secondary | ICD-10-CM

## 2021-12-06 MED ORDER — NORTRIPTYLINE HCL 75 MG PO CAPS: 75.0000 mg | ORAL_CAPSULE | Freq: Every day | ORAL | 5 refills | Status: AC

## 2022-04-04 ENCOUNTER — Encounter (HOSPITAL_COMMUNITY): Payer: Self-pay

## 2022-04-04 ENCOUNTER — Ambulatory Visit (HOSPITAL_COMMUNITY)
Admission: EM | Admit: 2022-04-04 | Discharge: 2022-04-04 | Disposition: A | Discharge status: Home / Self Care | Payer: Federal, State, Local not specified - PPO | Attending: Family Medicine | Admitting: Family Medicine

## 2022-04-04 DIAGNOSIS — R09A2 Foreign body sensation, throat: Secondary | ICD-10-CM | POA: Diagnosis not present

## 2022-04-04 MED ORDER — PANTOPRAZOLE SODIUM 40 MG PO TBEC: 40.0000 mg | DELAYED_RELEASE_TABLET | Freq: Every day | ORAL | 0 refills | Status: AC

## 2022-04-04 NOTE — ED Triage Notes (Signed)
Pt states feels like a pill is stuck in her throat x2wks. States has had similar episode which went away after drinking water. No distress noted.

## 2022-04-04 NOTE — ED Provider Notes (Signed)
  [facility] [facility]   [id number] 04/04/22 Arrival Time: 2505  ASSESSMENT & PLAN:  1. Globus pharyngeus    Visible throat looks normal. Trial of: Meds ordered this encounter  Medications   pantoprazole (PROTONIX) 40 MG tablet    Sig: Take 1 tablet (40 mg total) by mouth daily.    Dispense:  30 tablet    Refill:  0    Orders Placed This Encounter  Procedures   Ambulatory referral to Gastroenterology    Referral Priority:   Routine    Referral Type:   Consultation    Referral Reason:   Specialty Services Required    Referred to Provider:   Arta Silence, MD    Requested Specialty:   Gastroenterology    Number of Visits Requested:   1   No swallowing/resp difficulties.  Reviewed expectations re: course of current medical issues. Questions answered. Outlined signs and symptoms indicating need for more acute intervention. Patient verbalized understanding. After Visit Summary given.  SUBJECTIVE:  Erica Bruce is a 22 y.o. female who reports sensation like "a pill is stuck" in her throat x 2 wks. States has had similar episode which went away after drinking water. No distress noted. No swallowing or resp difficulties. No h/o GERD. No tx PTA.   OBJECTIVE:  Vitals:   04/04/22 1635  BP: 115/64  Pulse: 98  Resp: 18  Temp: (!) 90 F (32.2 C)  TempSrc: Oral  SpO2: 98%     General appearance; alert; no distress HEENT: throat appears normal; uvula is midline Neck: supple with FROM; no lymphadenopathy; trachea is midline; no neck masses appreciated Lungs: speaks full sentences without difficulty; unlabored Skin: reveals no rash; warm and dry Psychological: alert and cooperative; normal mood and affect  No Known Allergies  History reviewed. No pertinent past medical history. Social History   Socioeconomic History   Marital status: Single    Spouse name: Not on file   Number of children: Not on file   Years of education: Not on file   Highest education level:  Not on file  Occupational History   Not on file  Tobacco Use   Smoking status: Never   Smokeless tobacco: Never  Vaping Use   Vaping Use: Never used  Substance and Sexual Activity   Alcohol use: Not Currently   Drug use: Not Currently   Sexual activity: Not on file  Other Topics Concern   Not on file  Social History Narrative   Right Handed   Lives in a dorm   Drinks some caffeine   Social Determinants of Health   Financial Resource Strain: Not on file  Food Insecurity: Not on file  Transportation Needs: Not on file  Physical Activity: Not on file  Stress: Not on file  Social Connections: Not on file  Intimate Partner Violence: Not on file   Family History  Problem Relation Age of Onset   Healthy Mother    Cerebral aneurysm Father            Vanessa Kick, MD 04/04/22 1651

## 2022-05-10 ENCOUNTER — Ambulatory Visit: Payer: Federal, State, Local not specified - PPO | Admitting: Neurology

## 2022-06-28 ENCOUNTER — Other Ambulatory Visit: Payer: Self-pay | Admitting: Neurology

## 2022-06-28 NOTE — Telephone Encounter (Signed)
LMOVM for patient, Please give [place] a call to schedule a f/u. So we can send in enough refills to last.

## 2024-01-01 IMAGING — MR MR HEAD WO/W CM
12 series · 48 of 48 positions shown · IV contrast (multihance)
Comparison: None.

CLINICAL DATA: Headache, new or worsening, neuro deficit (Age
18-49y)

EXAM:
MRI HEAD WITHOUT AND WITH CONTRAST
TECHNIQUE: Multiplanar, multiecho pulse sequences of the brain and surrounding
structures were obtained without and with intravenous contrast.
CONTRAST:  15mL MULTIHANCE GADOBENATE DIMEGLUMINE 529 MG/ML IV SOLN

[Series 2: T1 · sagittal · 5.0mm · 0.49mm/px · 2 of 24 slices shown]
[im 1/24]
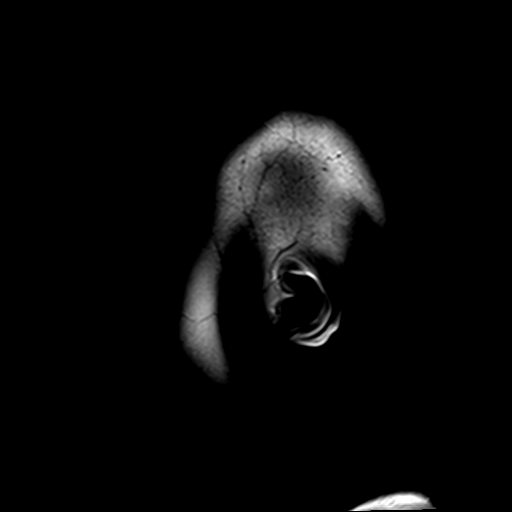
[im 24/24]
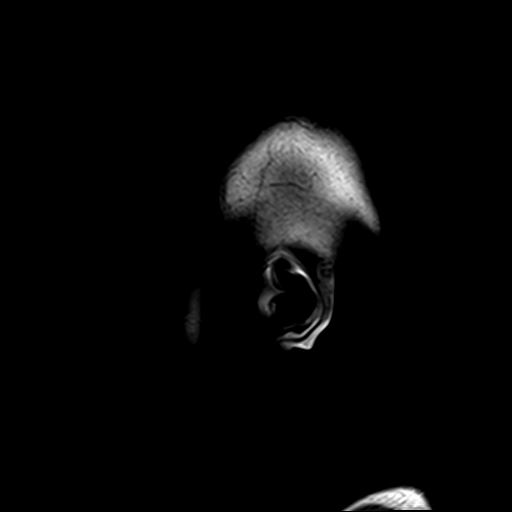

[Series 3: DWI · axial · 3.0mm · 1.88mm/px · z∈[-69,+76]mm · 7 of 100 slices shown (1 of 4)]
[im 1/100]
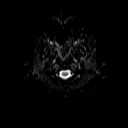
[im 17/100]
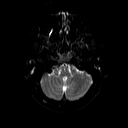
[im 34/100]
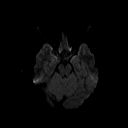
[im 50/100]
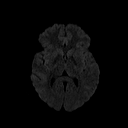
[im 67/100]
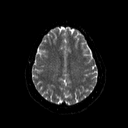
[im 83/100]
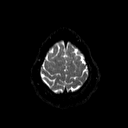
[im 100/100]
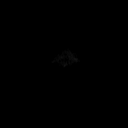

[Series 4: DWI · axial · 3.0mm · 1.88mm/px · z∈[-69,+76]mm · 3 of 50 slices shown (2 of 4)]
[im 1/50]
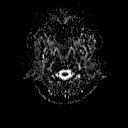
[im 25/50]
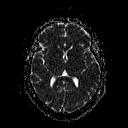
[im 50/50]
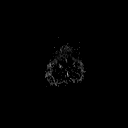

[Series 5: DWI · coronal · 5.0mm · 1.80mm/px · 5 of 71 slices shown (3 of 4)]
[im 1/71]
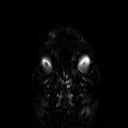
[im 18/71]
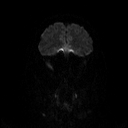
[im 36/71]
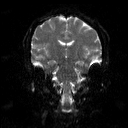
[im 53/71]
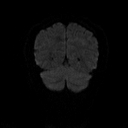
[im 71/71]
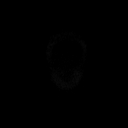

[Series 6: DWI · coronal · 5.0mm · 1.80mm/px · 2 of 36 slices shown (4 of 4)]
[im 1/36]
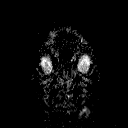
[im 36/36]
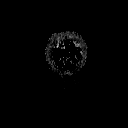

[Series 7: T2 · axial · 5.0mm · 0.60mm/px · 1 of 22 slices shown (1 of 2)]
[im 1/22]
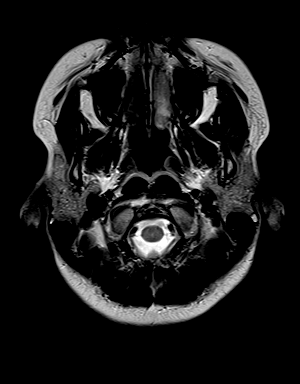

[Series 8: FLAIR · axial · 3.0mm · 0.45mm/px · z∈[-67,+75]mm · 2 of 32 slices shown]
[im 1/32]
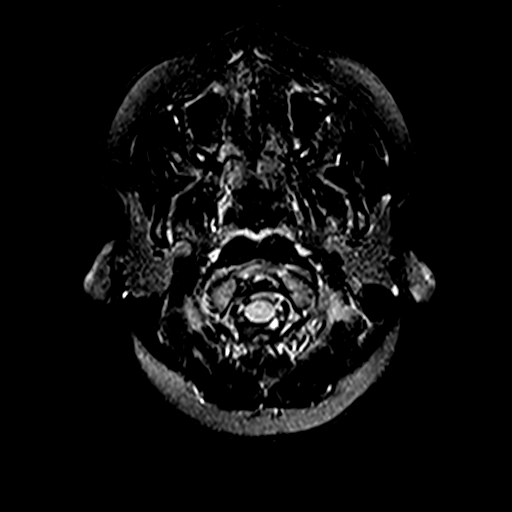
[im 32/32]
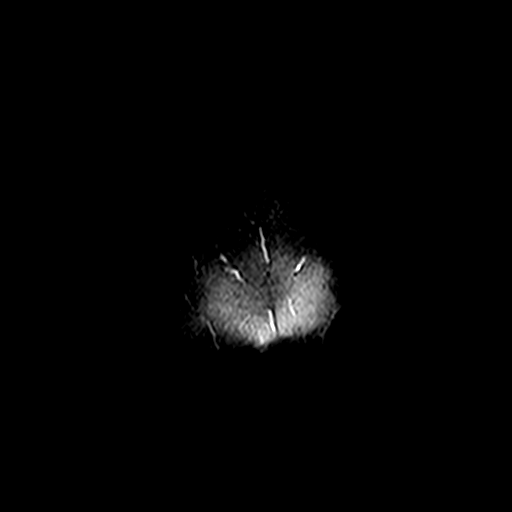

[Series 10: swi_images · axial · 4.0mm · 0.90mm/px · z∈[-65,+73]mm · 2 of 36 slices shown]
[im 1/36]
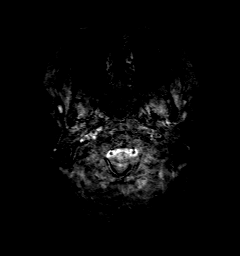
[im 36/36]
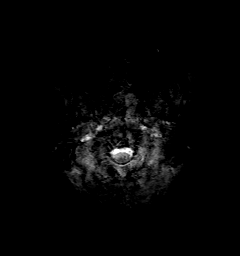

[Series 11: t1_mpr_tra · axial · 1.0mm · 0.75mm/px · z∈[-67,+74]mm · 10 of 144 slices shown]
[im 1/144]
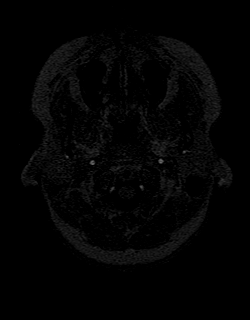
[im 16/144]
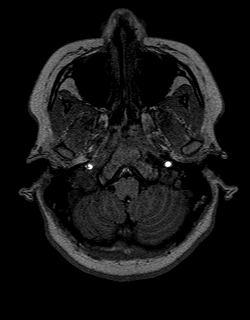
[im 32/144]
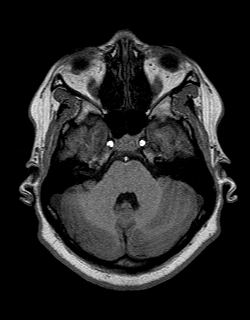
[im 48/144]
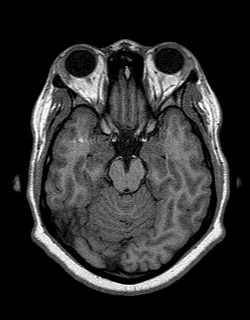
[im 64/144]
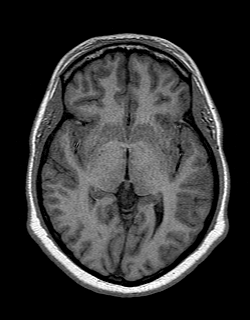
[im 80/144]
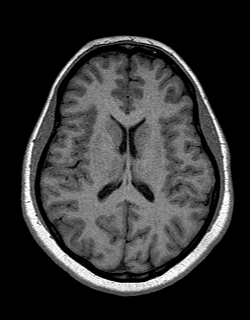
[im 96/144]
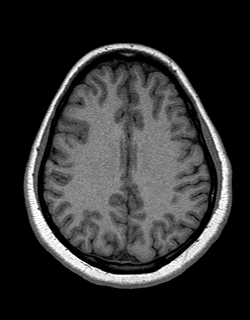
[im 112/144]
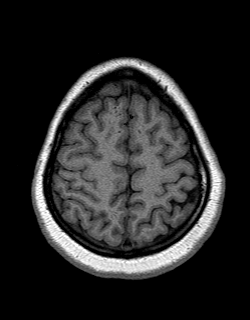
[im 128/144]
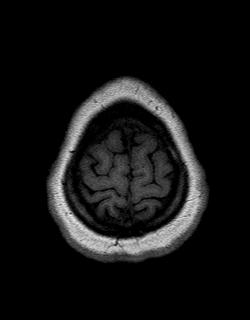
[im 144/144]
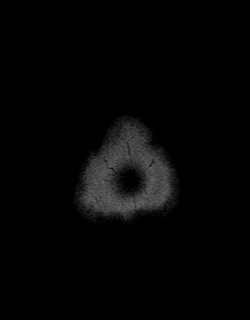

[Series 12: T2 · coronal · 5.0mm · 0.45mm/px · 2 of 28 slices shown (2 of 2)]
[im 1/28]
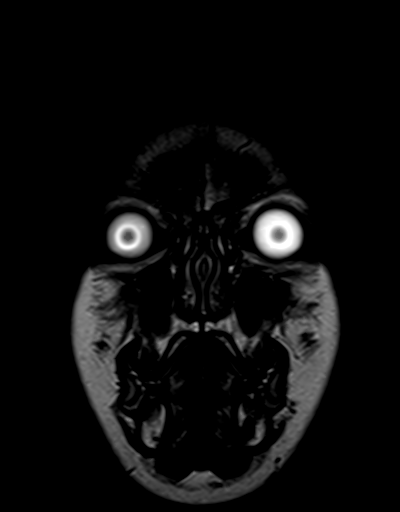
[im 28/28]
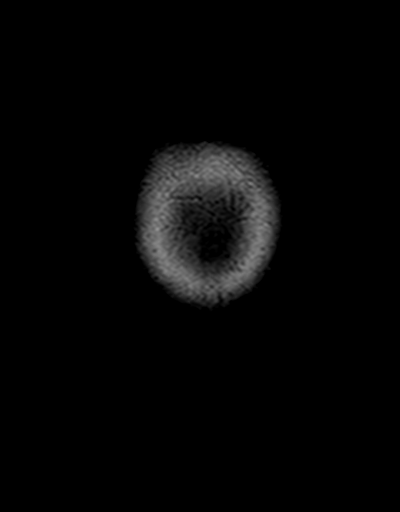

[Series 13: t1_mpr_tra post · axial · 1.0mm · 0.75mm/px · z∈[-67,+74]mm · 10 of 144 slices shown]
[im 1/144]
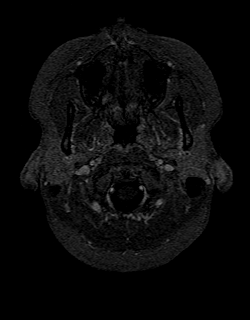
[im 16/144]
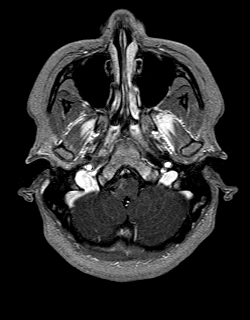
[im 32/144]
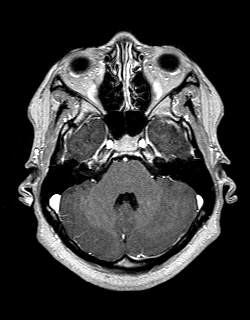
[im 48/144]
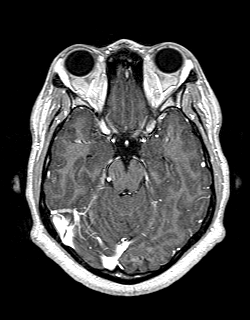
[im 64/144]
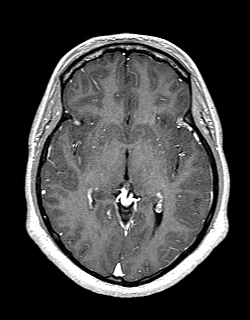
[im 80/144]
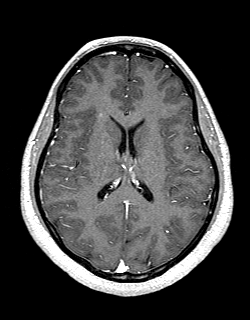
[im 96/144]
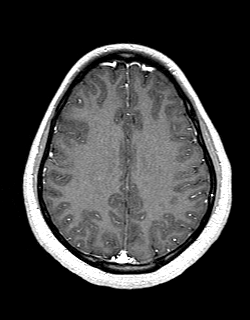
[im 112/144]
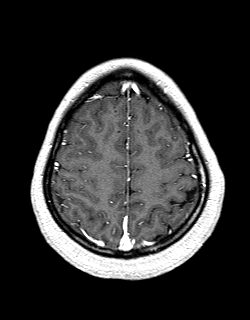
[im 128/144]
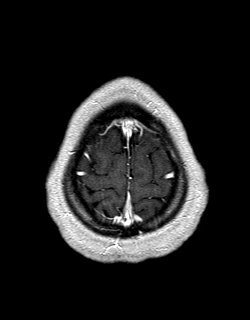
[im 144/144]
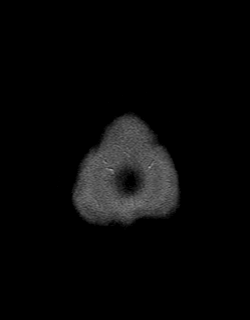

[Series 14: post cor · coronal · 5.0mm · 0.45mm/px · 2 of 28 slices shown]
[im 1/28]
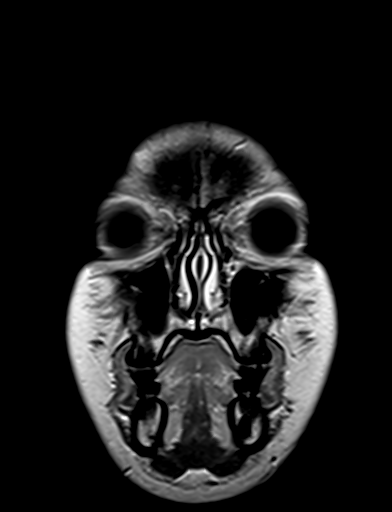
[im 28/28]
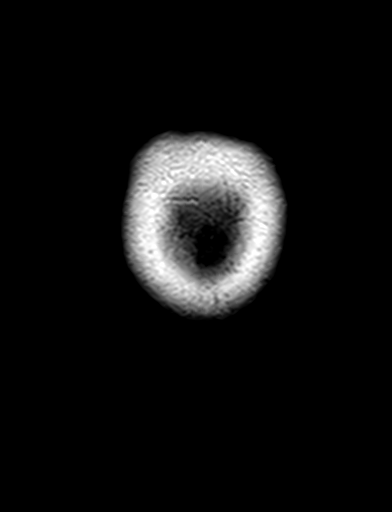

[48 of 48 positions shown; findings below may reference images not displayed]

FINDINGS: Brain: No acute infarction, hemorrhage, hydrocephalus, extra-axial
collection or mass lesion. No abnormal enhancement.

Vascular: Major arterial flow voids are maintained at the skull
base.

Skull and upper cervical spine: Normal marrow signal.

Sinuses/Orbits: Largely clear sinuses.  Unremarkable orbits.

Other: No mastoid effusions.
IMPRESSION: Normal brain MRI.  No evidence of acute abnormality.
